# Patient Record
Sex: Female | Born: 1990 | Race: Black or African American | Hispanic: No | Marital: Single | Smoking: Former smoker
Health system: Southern US, Community
[De-identification: ages and names within clinical notes are randomized; demographics above are authoritative.]

## PROBLEM LIST (undated history)

## (undated) DIAGNOSIS — L438 Other lichen planus: Secondary | ICD-10-CM

---
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
## 2012-01-20 IMAGING — US US OB FOLLOW-UP
1 series · 14 of 28 positions shown · non-contrast
Comparison: none

[Series 1: us ob follow-up · 14 of 43 slices shown]
[im 2/43]
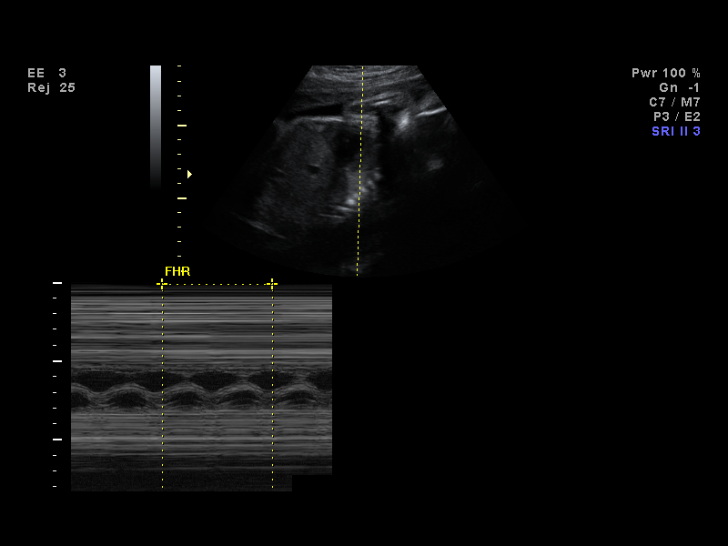
[im 5/43]
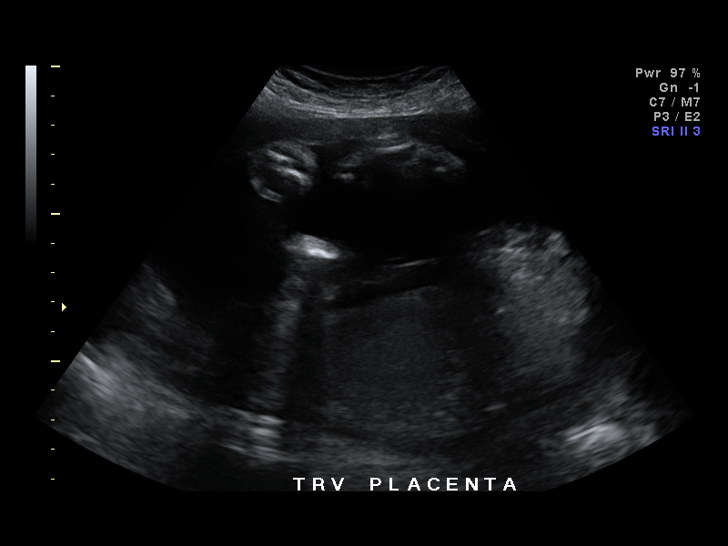
[im 8/43]
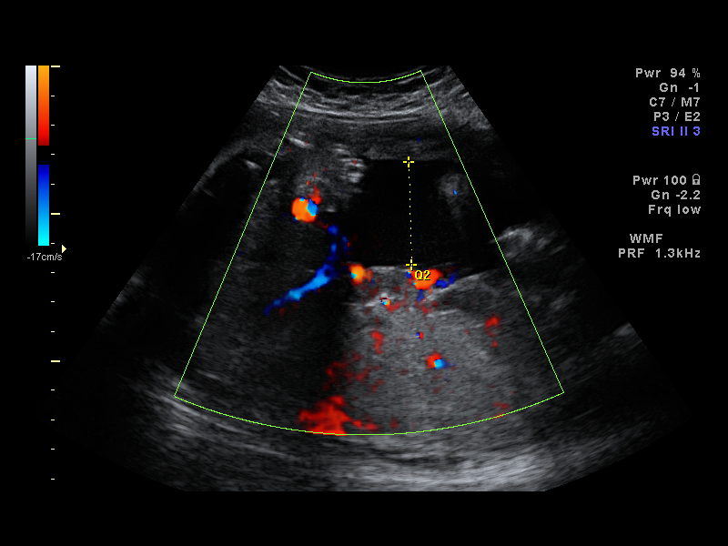
[im 11/43]
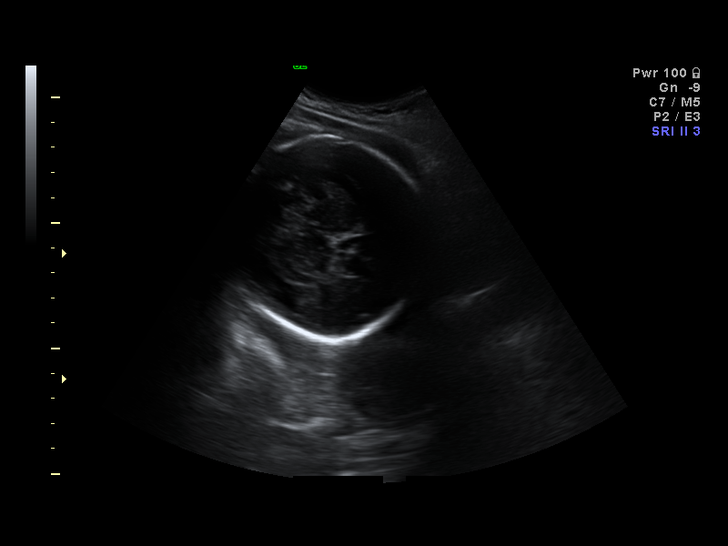
[im 15/43]
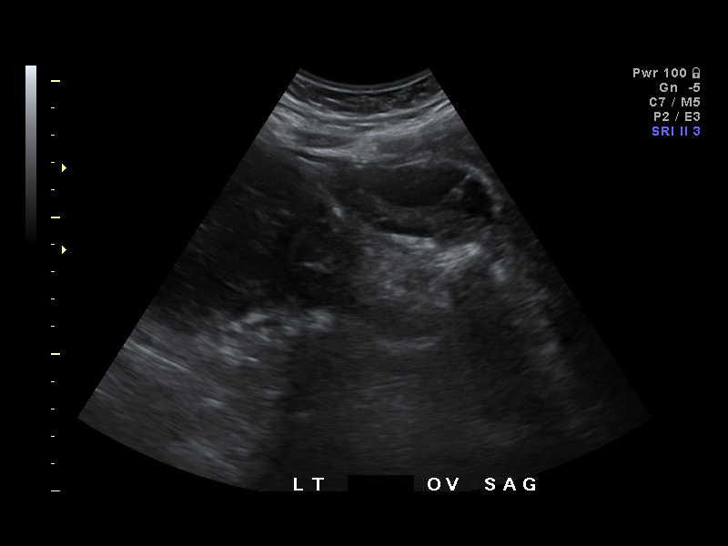
[im 18/43]
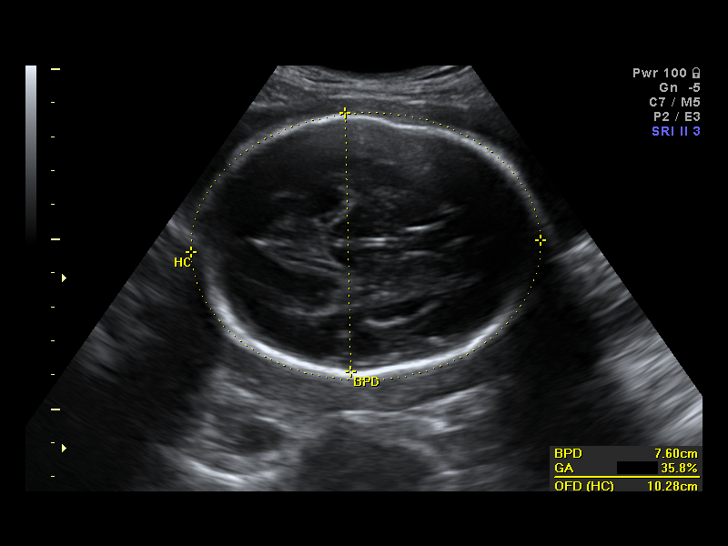
[im 21/43]
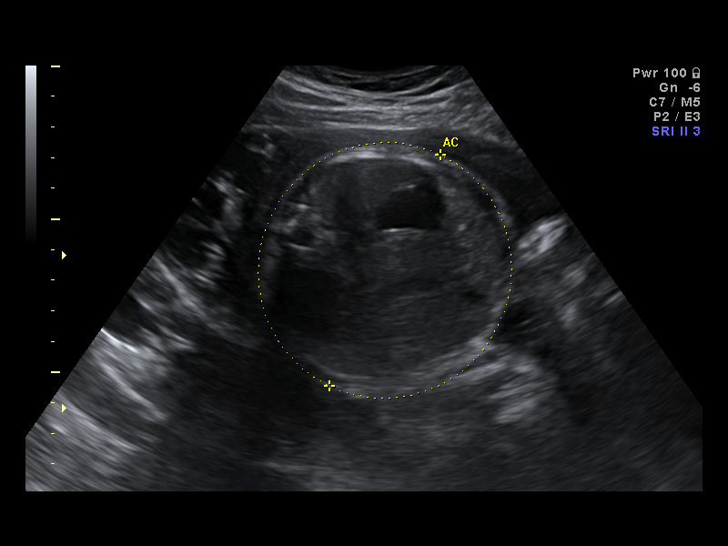
[im 24/43]
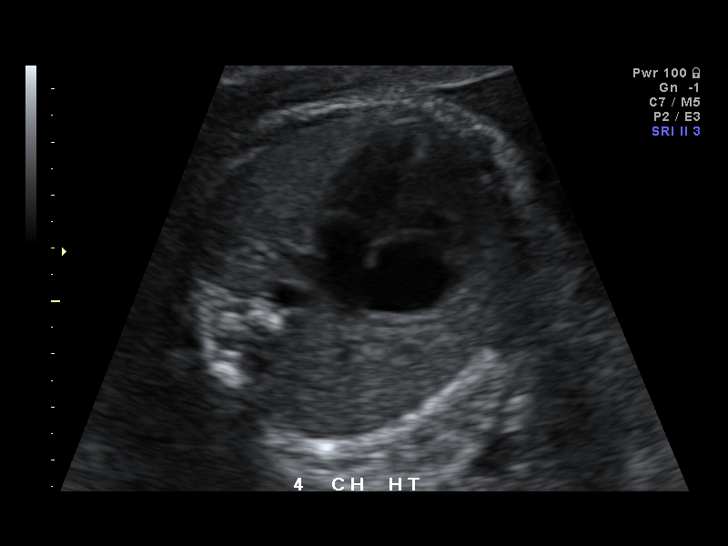
[im 27/43]
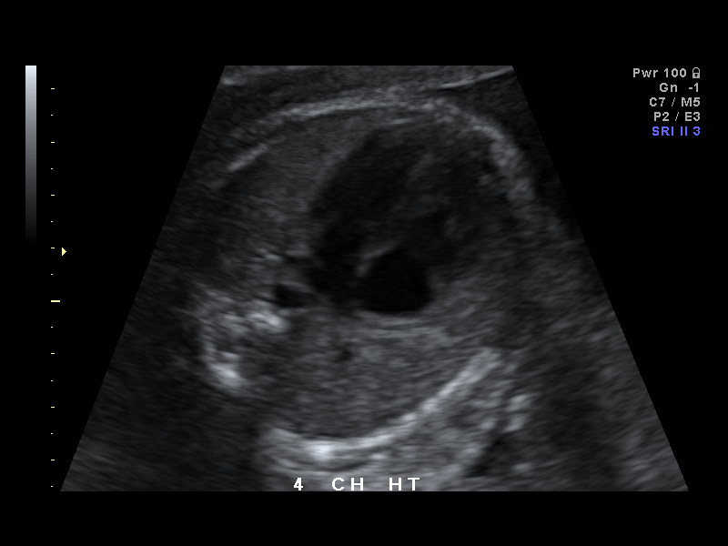
[im 30/43]
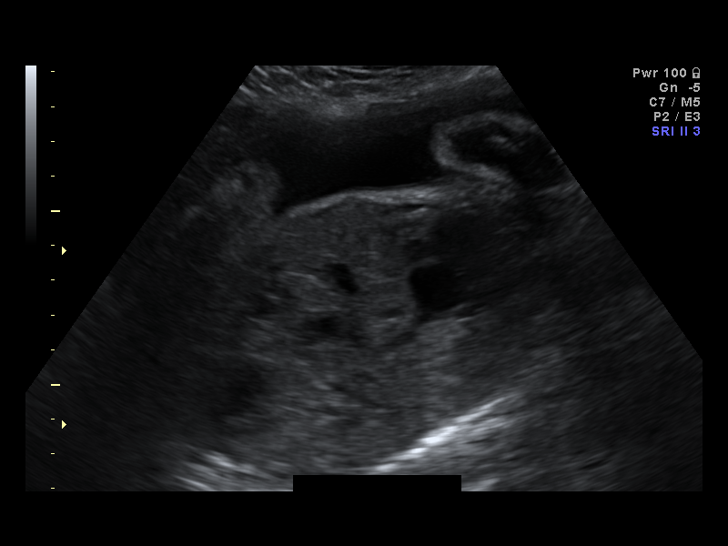
[im 33/43]
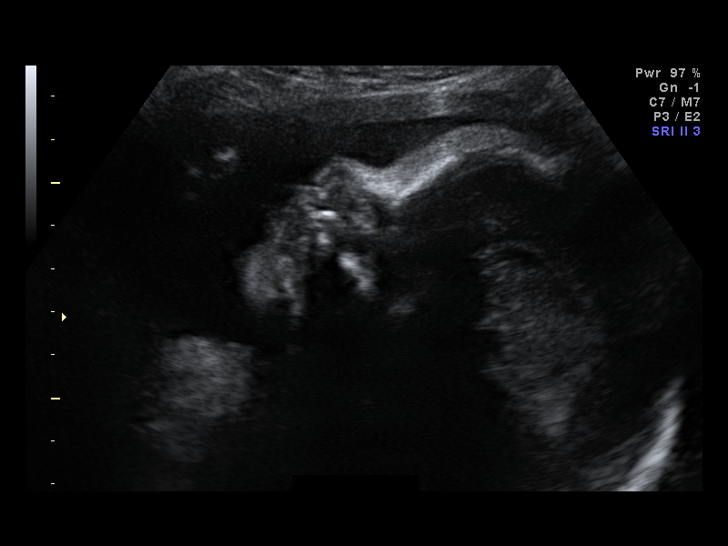
[im 36/43]
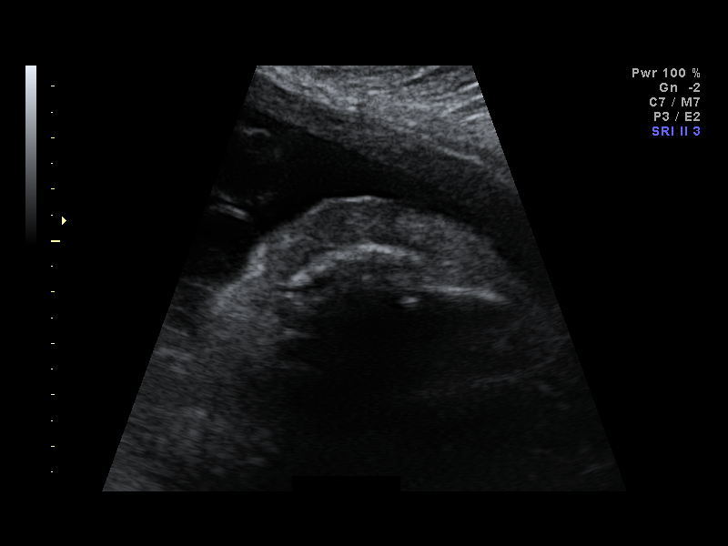
[im 39/43]
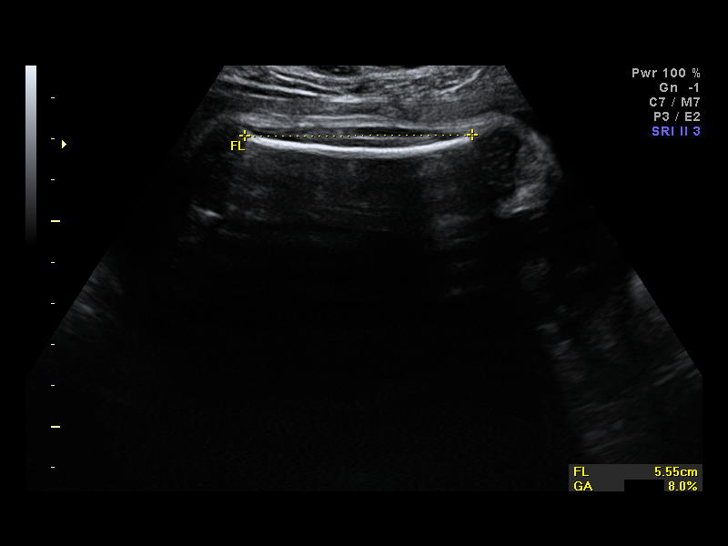
[im 43/43]
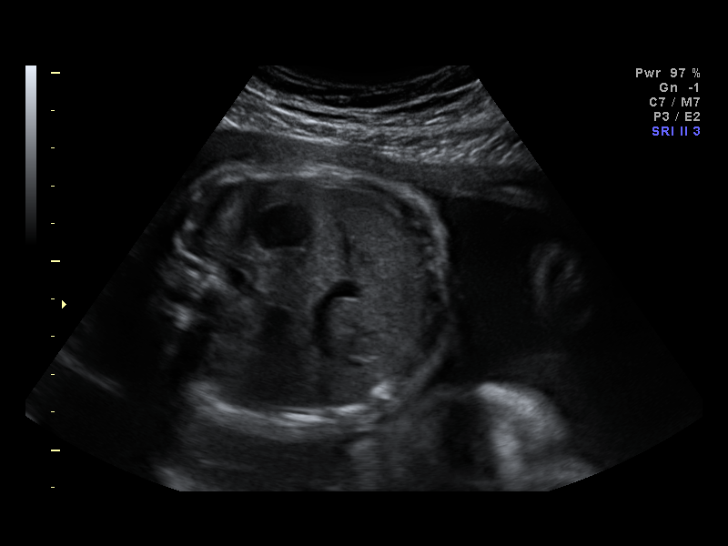

[14 of 28 positions shown; findings below may reference images not displayed]

Canned report from images found in remote index.

Refer to host system for actual result text.

## 2012-03-09 IMAGING — US US OB FOLLOW-UP
1 series · 14 of 23 positions shown · non-contrast
Comparison: none

[Series 1: us ob follow-up · 0.21mm/px · 14 of 23 slices shown]
[im 1/23]
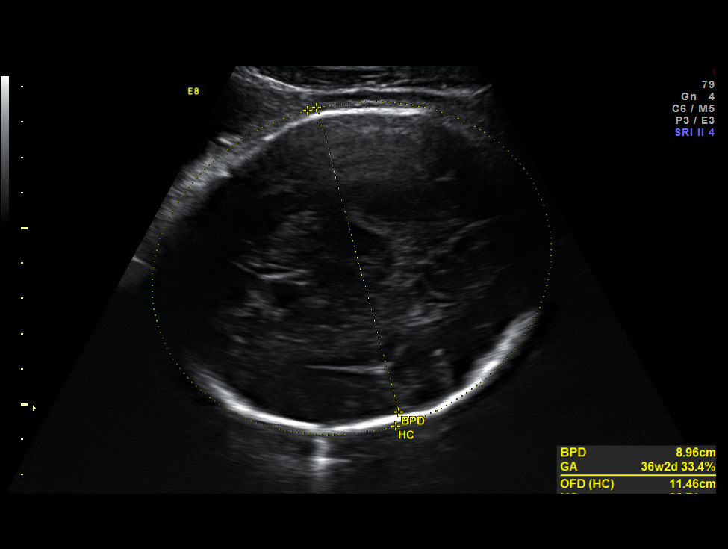
[im 3/23]
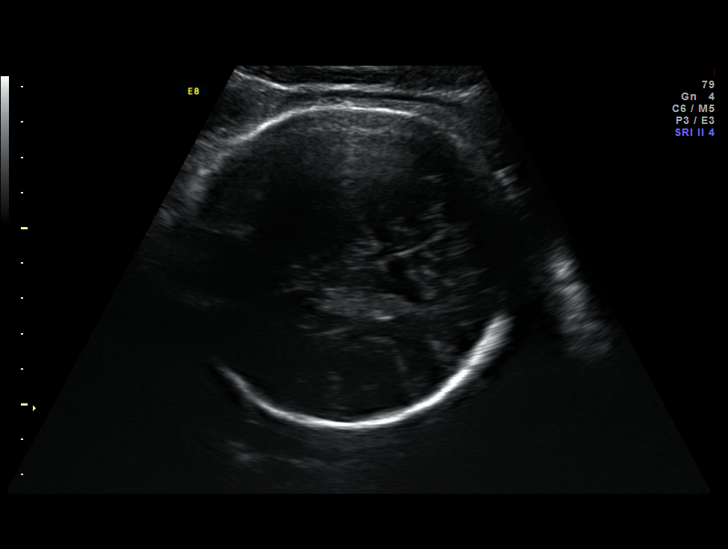
[im 5/23]
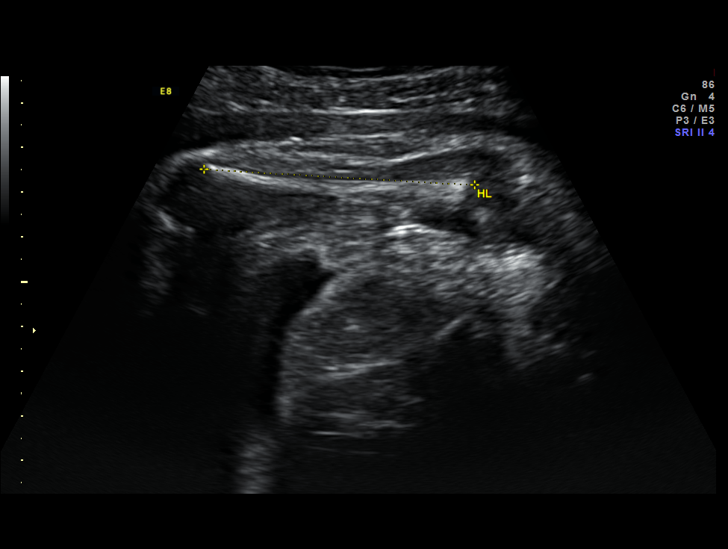
[im 6/23]
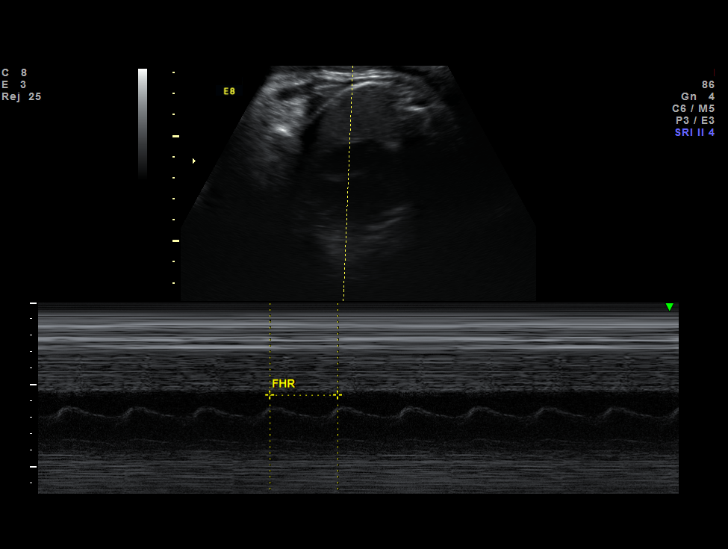
[im 8/23]
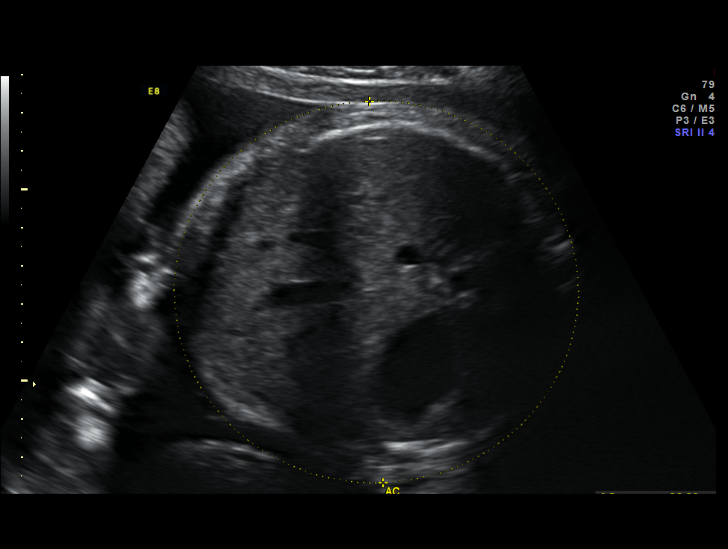
[im 10/23]
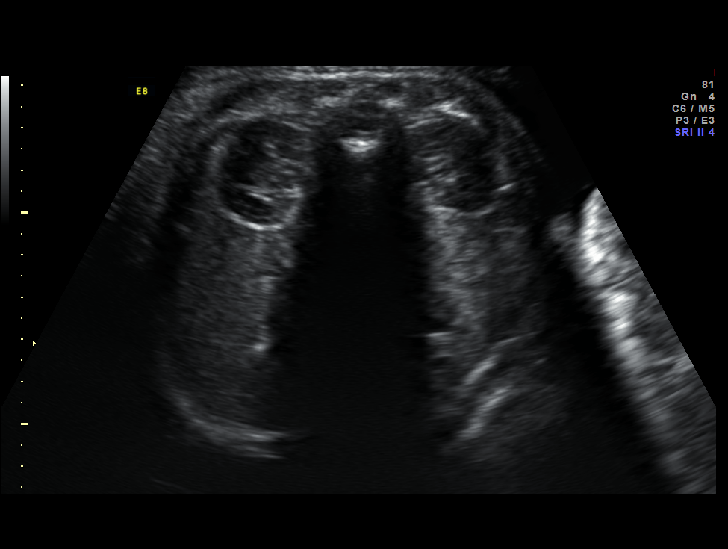
[im 11/23]
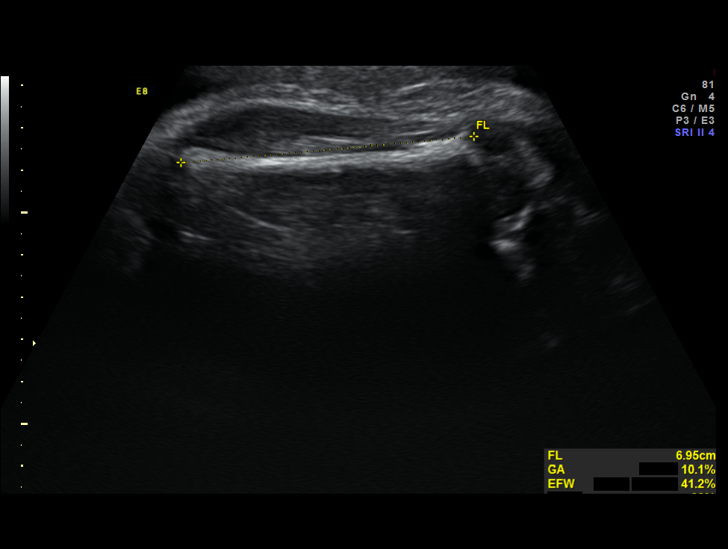
[im 13/23]
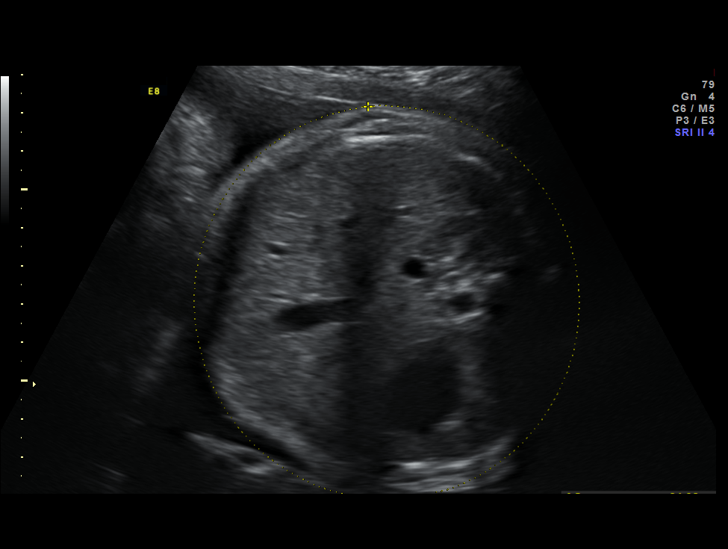
[im 14/23]
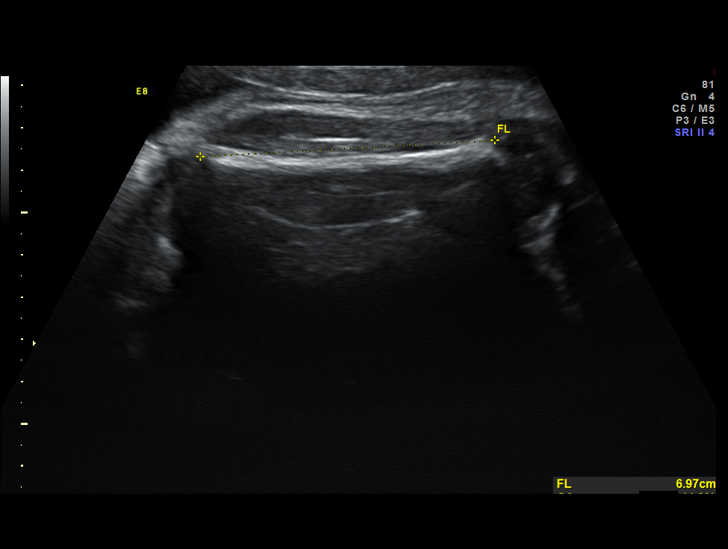
[im 16/23]
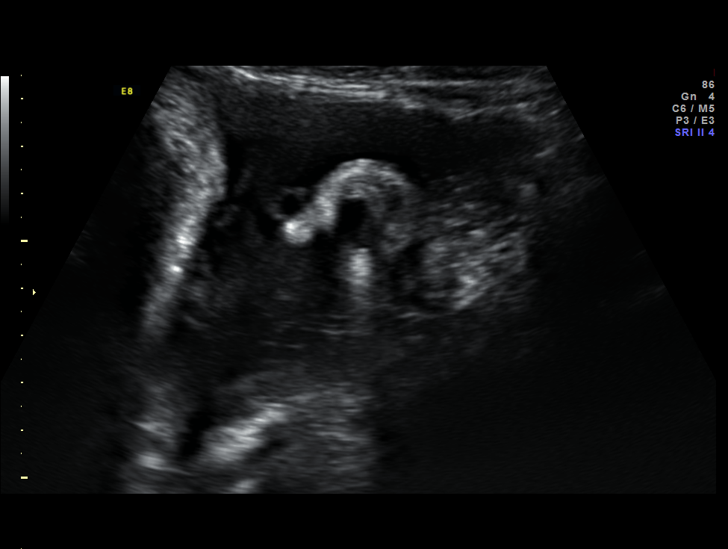
[im 18/23]
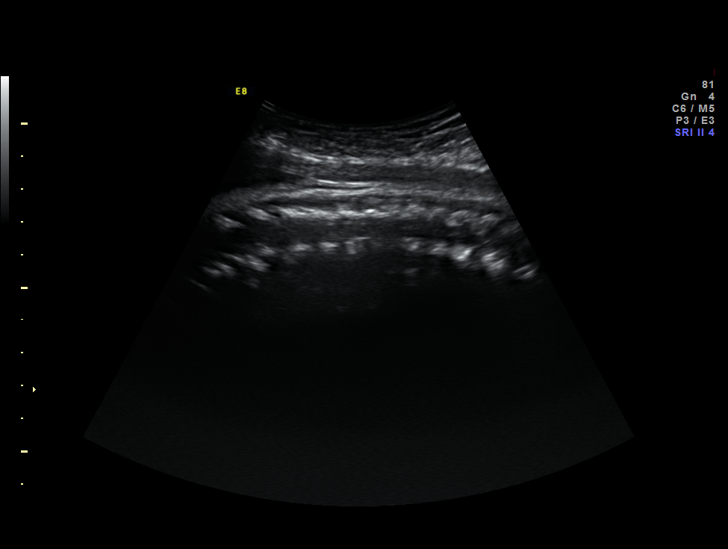
[im 19/23]
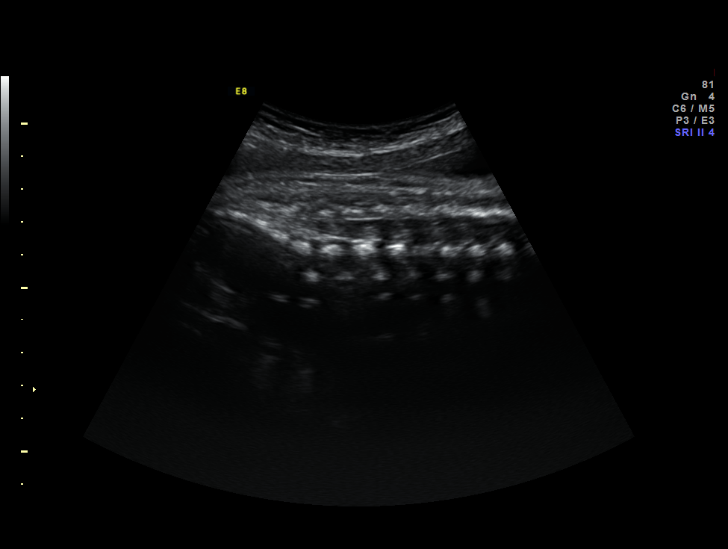
[im 21/23]
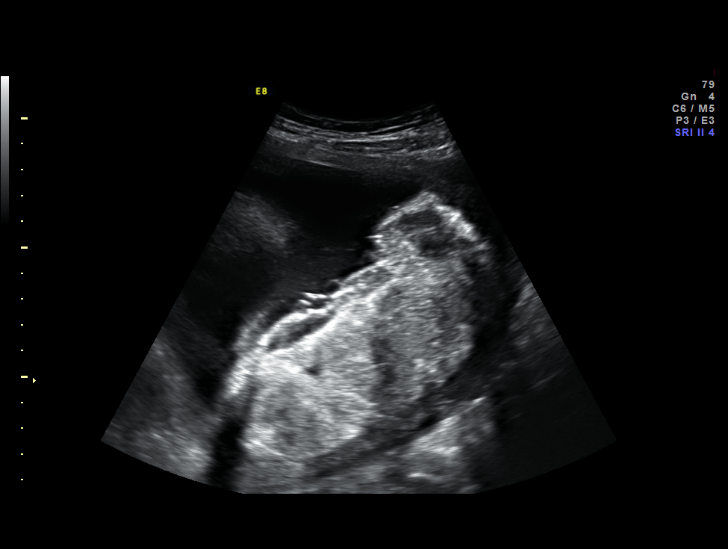
[im 23/23]
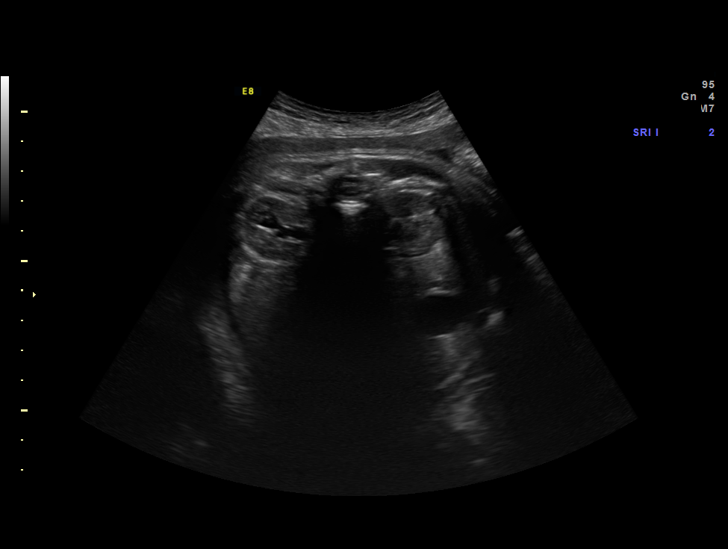

[14 of 23 positions shown; findings below may reference images not displayed]

Canned report from images found in remote index.

Refer to host system for actual result text.

## 2012-09-09 ENCOUNTER — Encounter (HOSPITAL_COMMUNITY): Payer: Self-pay | Admitting: *Deleted

## 2012-09-09 ENCOUNTER — Emergency Department (HOSPITAL_COMMUNITY)
Admission: EM | Admit: 2012-09-09 | Discharge: 2012-09-10 | Disposition: A | Discharge status: Home / Self Care | Payer: Medicaid Other | Attending: Emergency Medicine | Admitting: Emergency Medicine

## 2012-09-09 DIAGNOSIS — F17200 Nicotine dependence, unspecified, uncomplicated: Secondary | ICD-10-CM | POA: Insufficient documentation

## 2012-09-09 DIAGNOSIS — Z79899 Other long term (current) drug therapy: Secondary | ICD-10-CM | POA: Insufficient documentation

## 2012-09-09 DIAGNOSIS — IMO0002 Reserved for concepts with insufficient information to code with codable children: Secondary | ICD-10-CM | POA: Insufficient documentation

## 2012-09-09 DIAGNOSIS — Z3202 Encounter for pregnancy test, result negative: Secondary | ICD-10-CM | POA: Insufficient documentation

## 2012-09-09 DIAGNOSIS — L439 Lichen planus, unspecified: Secondary | ICD-10-CM | POA: Insufficient documentation

## 2012-09-09 HISTORY — DX: Other lichen planus: L43.8

## 2012-09-09 MED ORDER — AZATHIOPRINE 50 MG PO TABS: 50.0000 mg | ORAL_TABLET | Freq: Every day | ORAL | Status: AC

## 2012-09-09 MED ORDER — PREDNISONE 10 MG PO TABS: ORAL_TABLET | ORAL | Status: DC

## 2012-09-09 NOTE — ED Provider Notes (Signed)
History     CSN: [id number]  Arrival date & time 09/09/12  2029   First MD Initiated Contact with Patient 09/09/12 2232      Chief Complaint  Patient presents with  . Mouth Lesions    (Consider location/radiation/quality/duration/timing/severity/associated sxs/prior treatment) Patient is a 22 y.o. female presenting with mouth sores.  Mouth Lesions  Episode onset: 4 days ago. The onset was gradual. The problem occurs continuously. The problem has been gradually worsening. The problem is severe. Nothing relieves the symptoms. Nothing aggravates the symptoms. Associated symptoms include mouth sores. Pertinent negatives include no fever, no abdominal pain, no diarrhea, no nausea, no vomiting, no congestion and no cough. Associated symptoms comments: No drooling, no inability to swallow, no respiratory problems.    Past Medical History  Diagnosis Date  . Oral lichen planus     History reviewed. No pertinent past surgical history.  No family history on file.  History  Substance Use Topics  . Smoking status: Current Every Day Smoker  . Smokeless tobacco: Not on file  . Alcohol Use: No    OB History   Grav Para Term Preterm Abortions TAB SAB Ect Mult Living                  Review of Systems  Constitutional: Negative for fever.  HENT: Positive for mouth sores. Negative for congestion.   Respiratory: Negative for cough and shortness of breath.   Cardiovascular: Negative for chest pain.  Gastrointestinal: Negative for nausea, vomiting, abdominal pain and diarrhea.  Genitourinary: Negative for difficulty urinating.  All other systems reviewed and are negative.    Allergies  Penicillins  Home Medications   Current Outpatient Rx  Name  Route  Sig  Dispense  Refill  . azaTHIOprine (IMURAN) 50 MG tablet   Oral   Take 50 mg by mouth daily.         . predniSONE (DELTASONE) 5 MG tablet   Oral   Take 15 mg by mouth daily.         . tacrolimus (PROTOPIC) 0.1 %  ointment   Topical   Apply 1 application topically 2 (two) times daily.           BP 127/81  Pulse 111  Temp(Src) 99.1 F (37.3 C) (Oral)  Resp 14  SpO2 98%  LMP 09/09/2012  Physical Exam  Nursing note and vitals reviewed. Constitutional: She is oriented to person, place, and time. She appears well-developed and well-nourished. No distress.  HENT:  Head: Normocephalic and atraumatic.  Mouth/Throat: Oropharynx is clear and moist. Oral lesions (multiple oral lesions and ulcerations of lips, tongue, gums.  ) present. No edematous. No posterior oropharyngeal edema.  Eyes: Conjunctivae are normal. Pupils are equal, round, and reactive to light. No scleral icterus.  Neck: Neck supple.  Cardiovascular: Normal rate, regular rhythm, normal heart sounds and intact distal pulses.   No murmur heard. Pulmonary/Chest: Effort normal and breath sounds normal. No stridor. No respiratory distress. She has no rales.  Abdominal: Soft. Bowel sounds are normal. She exhibits no distension. There is no tenderness.  Musculoskeletal: Normal range of motion.  Neurological: She is alert and oriented to person, place, and time.  Skin: Skin is warm and dry. No rash noted.  Psychiatric: She has a normal mood and affect. Her behavior is normal.    ED Course  Procedures (including critical care time)  Labs Reviewed  POCT PREGNANCY, URINE   No results found.   1. 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  No drooling or stridor.  Handling secretions well.  Not systemically ill.  She has taken prednisone and azathioprine for this in the past, but has lost contact with her PCP and no longer has a prescription.  Believe that she is appropriate for outpatient treatment and will initiate therapy that has worked for her in past.  Advised close follow up and have given return precautions.          Rennis Petty, MD 09/10/12 [phone]

## 2012-09-09 NOTE — ED Notes (Addendum)
Mouth sores since Monday. Have oral erosive lichen planus. Lesions across upper and lower lips. And thrush on tongue. Usually takes imuran, prednisone, protopic cream when coming into hospital. No airway compromise. Difficult eating due to pain.

## 2012-09-10 NOTE — ED Provider Notes (Signed)
I saw and evaluated the patient, reviewed the resident's note and I agree with the findings and plan. Pt with hx oral lichen planus. States rx that worked well previously was imuran and pred.  No throat pain or trouble swallowing. Painful, crusty lesions/shallow ulcerations to lips. Rx. Derm f/u.   Suzi Roots, MD 09/10/12 1130

## 2015-08-15 ENCOUNTER — Ambulatory Visit (INDEPENDENT_AMBULATORY_CARE_PROVIDER_SITE_OTHER): Payer: Medicaid Other | Admitting: Certified Nurse Midwife

## 2015-08-15 ENCOUNTER — Encounter: Payer: Self-pay | Admitting: Certified Nurse Midwife

## 2015-08-15 VITALS — BP 116/75 | HR 98 | Temp 98.3°F | Wt 159.0 lb

## 2015-08-15 DIAGNOSIS — N760 Acute vaginitis: Secondary | ICD-10-CM

## 2015-08-15 DIAGNOSIS — Z3481 Encounter for supervision of other normal pregnancy, first trimester: Secondary | ICD-10-CM | POA: Diagnosis not present

## 2015-08-15 DIAGNOSIS — B9689 Other specified bacterial agents as the cause of diseases classified elsewhere: Secondary | ICD-10-CM

## 2015-08-15 DIAGNOSIS — A499 Bacterial infection, unspecified: Secondary | ICD-10-CM

## 2015-08-15 DIAGNOSIS — O219 Vomiting of pregnancy, unspecified: Secondary | ICD-10-CM

## 2015-08-15 LAB — POCT URINALYSIS DIPSTICK
BILIRUBIN UA: NEGATIVE
Glucose, UA: NEGATIVE
Ketones, UA: NEGATIVE
Leukocytes, UA: NEGATIVE
NITRITE UA: NEGATIVE
PH UA: 6
Protein, UA: NEGATIVE
RBC UA: 250
SPEC GRAV UA: 1.02
UROBILINOGEN UA: NEGATIVE

## 2015-08-15 MED ORDER — FLUCONAZOLE 100 MG PO TABS: 100.0000 mg | ORAL_TABLET | Freq: Once | ORAL | Status: DC

## 2015-08-15 MED ORDER — METRONIDAZOLE 0.75 % VA GEL: 1.0000 | Freq: Two times a day (BID) | VAGINAL | Status: DC

## 2015-08-15 MED ORDER — TERCONAZOLE 0.4 % VA CREA: 1.0000 | TOPICAL_CREAM | Freq: Every day | VAGINAL | Status: DC

## 2015-08-15 MED ORDER — DOXYLAMINE-PYRIDOXINE 10-10 MG PO TBEC: DELAYED_RELEASE_TABLET | ORAL | Status: AC

## 2015-08-15 MED ORDER — VITAFOL GUMMIES 3.33-0.333-34.8 MG PO CHEW: 3.0000 | CHEWABLE_TABLET | Freq: Every day | ORAL | Status: AC

## 2015-08-15 NOTE — Progress Notes (Signed)
Subjective:    Robin Mitchell is being seen today for her first obstetrical visit.  This is a planned pregnancy. She is at [redacted]w[redacted]d gestation. Her obstetrical history is significant for immune disorder: oral errosive lichen planus, takes prednisone for it, last reported outbreak Nov. 2016. Relationship with FOB: significant other, living together. Patient does intend to breast feed. Pregnancy history fully reviewed.  The information documented in the HPI was reviewed and verified.  Menstrual History: OB History    Gravida Para Term Preterm AB TAB SAB Ectopic Multiple Living   Menarche age: 25 years of age  Patient's last menstrual period was 06/02/2015.    Past Medical History  Diagnosis Date  . Oral lichen planus     History reviewed. No pertinent past surgical history.   (Not in a hospital admission) Allergies  Allergen Reactions  . Penicillins Hives    Social History  Substance Use Topics  . Smoking status: Former Games developer  . Smokeless tobacco: Not on file  . Alcohol Use: No    History reviewed. No pertinent family history.   Review of Systems Constitutional: negative for weight loss Gastrointestinal: negative for vomiting, + nausea Genitourinary:negative for genital lesions and vaginal discharge and dysuria Musculoskeletal:negative for back pain Behavioral/Psych: negative for abusive relationship, depression, illegal drug usage and tobacco use    Objective:    BP 116/75 mmHg  Pulse 98  Temp(Src) 98.3 F (36.8 C)  Wt 159 lb (72.122 kg)  LMP 06/02/2015 General Appearance:    Alert, cooperative, no distress, appears stated age  Head:    Normocephalic, without obvious abnormality, atraumatic  Eyes:    PERRL, conjunctiva/corneas clear, EOM's intact, fundi    benign, both eyes  Ears:    Normal TM's and external ear canals, both ears  Nose:   Nares normal, septum midline, mucosa normal, no drainage    or sinus tenderness  Throat:   Lips, mucosa, and  tongue normal; teeth and gums normal  Neck:   Supple, symmetrical, trachea midline, no adenopathy;    thyroid:  no enlargement/tenderness/nodules; no carotid   bruit or JVD  Back:     Symmetric, no curvature, ROM normal, no CVA tenderness  Lungs:     Clear to auscultation bilaterally, respirations unlabored  Chest Wall:    No tenderness or deformity   Heart:    Regular rate and rhythm, S1 and S2 normal, no murmur, rub   or gallop  Breast Exam:    No tenderness, masses, or nipple abnormality  Abdomen:     Soft, non-tender, bowel sounds active all four quadrants,    no masses, no organomegaly  Genitalia:    Normal female without lesion, discharge or tenderness  Extremities:   Extremities normal, atraumatic, no cyanosis or edema  Pulses:   2+ and symmetric all extremities  Skin:   Skin color, texture, turgor normal, no rashes or lesions  Lymph nodes:   Cervical, supraclavicular, and axillary nodes normal  Neurologic:   CNII-XII intact, normal strength, sensation and reflexes    throughout     Cervix:  Long, thick, closed and posterior.  + thin gray vaginal discharge noted, + chuncky white discharge noted.     FHR: seen with bedside US, by doppler in the 160's.     Lab Review Urine pregnancy test Labs reviewed yes Radiologic studies reviewed no Assessment:    Pregnancy at [redacted]w[redacted]d weeks  Nausea in early pregnancy  Vaginitis  BV  Plan:      Prenatal vitamins.  Counseling provided regarding continued use of seat belts, cessation of alcohol consumption, smoking or use of illicit drugs; infection precautions i.e., influenza/TDAP immunizations, toxoplasmosis,CMV, parvovirus, listeria and varicella; workplace safety, exercise during pregnancy; routine dental care, safe medications, sexual activity, hot tubs, saunas, pools, travel, caffeine use, fish and methlymercury, potential toxins, hair treatments, varicose veins Weight gain recommendations per IOM guidelines reviewed: underweight/BMI<  18.5--> gain 28 - 40 lbs; normal weight/BMI 18.5 - 24.9--> gain 25 - 35 lbs; overweight/BMI 25 - 29.9--> gain 15 - 25 lbs; obese/BMI >30->gain  11 - 20 lbs Problem list reviewed and updated. FIRST/CF mutation testing/NIPT/QUAD SCREEN/fragile X/Ashkenazi Jewish population testing/Spinal muscular atrophy discussed: requested. Role of ultrasound in pregnancy discussed; fetal survey: requested. Amniocentesis discussed: not indicated. VBAC calculator score: VBAC consent form provided Meds ordered this encounter  Medications  . Prenatal Vit-Fe Phos-FA-Omega (VITAFOL GUMMIES) 3.33-0.333-34.8 MG CHEW    Sig: Chew 3 tablets by mouth daily.    Dispense:  90 tablet    Refill:  12  . Doxylamine-Pyridoxine (DICLEGIS) 10-10 MG TBEC    Sig: Take 1 tablet with breakfast and lunch.  Take 2 tablets at bedtime.    Dispense:  100 tablet    Refill:  4  . metroNIDAZOLE (METROGEL VAGINAL) 0.75 % vaginal gel    Sig: Place 1 Applicatorful vaginally 2 (two) times daily.    Dispense:  70 g    Refill:  0  . fluconazole (DIFLUCAN) 100 MG tablet    Sig: Take 1 tablet (100 mg total) by mouth once. Repeat dose in 48-72 hour.    Dispense:  3 tablet    Refill:  0  . terconazole (TERAZOL 7) 0.4 % vaginal cream    Sig: Place 1 applicator vaginally at bedtime.    Dispense:  45 g    Refill:  0   Orders Placed This Encounter  Procedures  . Culture, OB Urine  . SureSwab, Vaginosis/Vaginitis Plus  . Obstetric panel  . HIV antibody  . Hemoglobinopathy evaluation  . Varicella zoster antibody, IgG  . VITAMIN D 25 Hydroxy (Vit-D Deficiency, Fractures)  . POCT urinalysis dipstick    Follow up in 4 weeks. 50% of 30 min visit spent on counseling and coordination of care.

## 2015-08-16 LAB — OBSTETRIC PANEL
ANTIBODY SCREEN: NEGATIVE
BASOS PCT: 0 % (ref 0–1)
Basophils Absolute: 0 10*3/uL (ref 0.0–0.1)
EOS ABS: 0.1 10*3/uL (ref 0.0–0.7)
EOS PCT: 1 % (ref 0–5)
HCT: 42.1 % (ref 36.0–46.0)
Hemoglobin: 14 g/dL (ref 12.0–15.0)
Hepatitis B Surface Ag: NEGATIVE
LYMPHS PCT: 46 % (ref 12–46)
Lymphs Abs: 4.3 10*3/uL — ABNORMAL HIGH (ref 0.7–4.0)
MCH: 28.8 pg (ref 26.0–34.0)
MCHC: 33.3 g/dL (ref 30.0–36.0)
MCV: 86.6 fL (ref 78.0–100.0)
MONO ABS: 0.7 10*3/uL (ref 0.1–1.0)
MPV: 9.6 fL (ref 8.6–12.4)
Monocytes Relative: 7 % (ref 3–12)
Neutro Abs: 4.3 10*3/uL (ref 1.7–7.7)
Neutrophils Relative %: 46 % (ref 43–77)
PLATELETS: 301 10*3/uL (ref 150–400)
RBC: 4.86 MIL/uL (ref 3.87–5.11)
RDW: 14.1 % (ref 11.5–15.5)
RH TYPE: POSITIVE
Rubella: 1.06 Index — ABNORMAL HIGH (ref <0.90)
WBC: 9.4 10*3/uL (ref 4.0–10.5)

## 2015-08-16 LAB — PAP IG W/ RFLX HPV ASCU

## 2015-08-16 LAB — VARICELLA ZOSTER ANTIBODY, IGG: VARICELLA IGG: 825.6 {index} — AB (ref <135.00)

## 2015-08-16 LAB — HIV ANTIBODY (ROUTINE TESTING W REFLEX): HIV 1&2 Ab, 4th Generation: NONREACTIVE

## 2015-08-16 LAB — VITAMIN D 25 HYDROXY (VIT D DEFICIENCY, FRACTURES): Vit D, 25-Hydroxy: 16 ng/mL — ABNORMAL LOW (ref 30–100)

## 2015-08-16 LAB — CULTURE, OB URINE
COLONY COUNT: NO GROWTH
Organism ID, Bacteria: NO GROWTH

## 2015-08-17 LAB — HEMOGLOBINOPATHY EVALUATION
HGB A2 QUANT: 2.6 % (ref 2.2–3.2)
HGB F QUANT: 0.3 % (ref 0.0–2.0)
Hemoglobin Other: 0 %
Hgb A: 97.1 % (ref 96.8–97.8)
Hgb S Quant: 0 %

## 2015-08-20 LAB — SURESWAB, VAGINOSIS/VAGINITIS PLUS
ATOPOBIUM VAGINAE: NOT DETECTED Log (cells/mL)
C. PARAPSILOSIS, DNA: NOT DETECTED
C. TROPICALIS, DNA: NOT DETECTED
C. albicans, DNA: NOT DETECTED
C. glabrata, DNA: NOT DETECTED
C. trachomatis RNA, TMA: DETECTED — AB
Gardnerella vaginalis: <4.7 Log (cells/mL)
LACTOBACILLUS SPECIES: 7 Log (cells/mL)
MEGASPHAERA SPECIES: NOT DETECTED Log (cells/mL)
N. GONORRHOEAE RNA, TMA: NOT DETECTED
T. VAGINALIS RNA, QL TMA: NOT DETECTED

## 2015-08-21 ENCOUNTER — Other Ambulatory Visit: Payer: Self-pay | Admitting: Certified Nurse Midwife

## 2015-08-21 ENCOUNTER — Telehealth: Payer: Self-pay | Admitting: *Deleted

## 2015-08-21 DIAGNOSIS — A749 Chlamydial infection, unspecified: Secondary | ICD-10-CM

## 2015-08-21 MED ORDER — AZITHROMYCIN 250 MG PO TABS: ORAL_TABLET | ORAL | Status: DC

## 2015-08-21 NOTE — Telephone Encounter (Signed)
Diclegis Rx has been approved via [employer].  Pharmacy made aware. Advised to make pt aware when available for pick up.

## 2015-09-12 ENCOUNTER — Encounter: Payer: Medicaid Other | Admitting: Certified Nurse Midwife

## 2015-09-12 ENCOUNTER — Ambulatory Visit (INDEPENDENT_AMBULATORY_CARE_PROVIDER_SITE_OTHER): Payer: Medicaid Other | Admitting: Certified Nurse Midwife

## 2015-09-12 VITALS — BP 127/71 | HR 98 | Temp 98.7°F | Wt 158.0 lb

## 2015-09-12 DIAGNOSIS — O219 Vomiting of pregnancy, unspecified: Secondary | ICD-10-CM

## 2015-09-12 DIAGNOSIS — Z3482 Encounter for supervision of other normal pregnancy, second trimester: Secondary | ICD-10-CM

## 2015-09-12 DIAGNOSIS — R319 Hematuria, unspecified: Secondary | ICD-10-CM

## 2015-09-12 LAB — POCT URINALYSIS DIPSTICK
Bilirubin, UA: NEGATIVE
GLUCOSE UA: NEGATIVE
Ketones, UA: NEGATIVE
Leukocytes, UA: NEGATIVE
Nitrite, UA: NEGATIVE
Protein, UA: NEGATIVE
SPEC GRAV UA: 1.025
UROBILINOGEN UA: 4
pH, UA: 6

## 2015-09-12 MED ORDER — ONDANSETRON HCL 4 MG PO TABS: 4.0000 mg | ORAL_TABLET | Freq: Every day | ORAL | Status: AC | PRN

## 2015-09-12 NOTE — Progress Notes (Signed)
  Subjective:    Robin Mitchell is a 25 y.o. female being seen today for her obstetrical visit. She is at 7156w4d gestation. Patient reports: backache, no bleeding, no contractions, no cramping and no leaking.  Problem List Items Addressed This Visit    None    Visit Diagnoses    Supervision of other normal pregnancy, antepartum, second trimester    -  Primary    Relevant Orders    POCT urinalysis dipstick (Completed)    AFP, Quad Screen    US OB Comp + 14 Wk    Hematuria        Relevant Orders    Culture, OB Urine    Nausea and vomiting in pregnancy prior to [redacted] weeks gestation        Relevant Medications    ondansetron (ZOFRAN) 4 MG tablet      Patient Active Problem List   Diagnosis Date Noted  . Encounter for supervision of other normal pregnancy in first trimester 08/15/2015    Objective:     BP 127/71 mmHg  Pulse 98  Temp(Src) 98.7 F (37.1 C)  Wt 158 lb (71.668 kg)  LMP 06/02/2015 Uterine Size: Below umbilicus     Assessment:    Pregnancy @ 2556w4d  weeks Hematuria     N&V in pregnancy prior to [redacted] weeks gestation  Plan:    Problem list reviewed and updated. Labs reviewed.  Follow up in 4 weeks. FIRST/CF mutation testing/NIPT/QUAD SCREEN/fragile X/Ashkenazi Jewish population testing/Spinal muscular atrophy discussed: ordered. Role of ultrasound in pregnancy discussed; fetal survey: ordered. Amniocentesis discussed: not indicated. 50% of 15 minute visit spent on counseling and coordination of care.

## 2015-09-14 ENCOUNTER — Other Ambulatory Visit: Payer: Self-pay | Admitting: Certified Nurse Midwife

## 2015-09-14 LAB — AFP, QUAD SCREEN
DIA Value (EIA): 82.41 pg/mL
DSR (BY AGE) 1 IN: 1097
Gestational Age: 14.6 WEEKS
MSAFP: 11.6 ng/mL
MSHCG: 55527 m[IU]/mL
Maternal Age At EDD: 23.2 YEARS
PDF: 0
T18 (By Age): 1:4274 {titer}
Weight: 158 [lb_av]
uE3 Value: 0.27 ng/mL

## 2015-09-14 LAB — CULTURE, OB URINE

## 2015-09-14 LAB — URINE CULTURE, OB REFLEX

## 2015-10-11 ENCOUNTER — Other Ambulatory Visit: Payer: Medicaid Other

## 2015-10-11 ENCOUNTER — Encounter: Payer: Medicaid Other | Admitting: Certified Nurse Midwife

## 2015-10-16 ENCOUNTER — Other Ambulatory Visit: Payer: Self-pay | Admitting: Certified Nurse Midwife

## 2016-06-19 ENCOUNTER — Encounter (HOSPITAL_COMMUNITY): Payer: Self-pay
